# Patient Record
Sex: Female | Born: 2005
Health system: Southern US, Community
[De-identification: ages and names within clinical notes are randomized; demographics above are authoritative.]

## PROBLEM LIST (undated history)

## (undated) DIAGNOSIS — F419 Anxiety disorder, unspecified: Secondary | ICD-10-CM

## (undated) DIAGNOSIS — F32A Depression, unspecified: Secondary | ICD-10-CM

## (undated) DIAGNOSIS — F909 Attention-deficit hyperactivity disorder, unspecified type: Secondary | ICD-10-CM

## (undated) HISTORY — DX: Depression, unspecified: F32.A

---
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
## 2023-01-19 DIAGNOSIS — F411 Generalized anxiety disorder: Secondary | ICD-10-CM | POA: Diagnosis not present

## 2023-02-16 DIAGNOSIS — F411 Generalized anxiety disorder: Secondary | ICD-10-CM | POA: Diagnosis not present

## 2023-02-24 DIAGNOSIS — F341 Dysthymic disorder: Secondary | ICD-10-CM | POA: Diagnosis not present

## 2023-02-24 DIAGNOSIS — F324 Major depressive disorder, single episode, in partial remission: Secondary | ICD-10-CM | POA: Diagnosis not present

## 2023-03-16 DIAGNOSIS — F411 Generalized anxiety disorder: Secondary | ICD-10-CM | POA: Diagnosis not present

## 2023-03-17 DIAGNOSIS — F324 Major depressive disorder, single episode, in partial remission: Secondary | ICD-10-CM | POA: Diagnosis not present

## 2023-04-13 DIAGNOSIS — F411 Generalized anxiety disorder: Secondary | ICD-10-CM | POA: Diagnosis not present

## 2023-04-19 DIAGNOSIS — Z0000 Encounter for general adult medical examination without abnormal findings: Secondary | ICD-10-CM | POA: Diagnosis not present

## 2023-05-11 DIAGNOSIS — F411 Generalized anxiety disorder: Secondary | ICD-10-CM | POA: Diagnosis not present

## 2023-05-18 DIAGNOSIS — Z6826 Body mass index (BMI) 26.0-26.9, adult: Secondary | ICD-10-CM | POA: Diagnosis not present

## 2023-05-18 DIAGNOSIS — Z01419 Encounter for gynecological examination (general) (routine) without abnormal findings: Secondary | ICD-10-CM | POA: Diagnosis not present

## 2023-05-25 ENCOUNTER — Other Ambulatory Visit (HOSPITAL_BASED_OUTPATIENT_CLINIC_OR_DEPARTMENT_OTHER): Payer: Self-pay

## 2023-05-25 MED ORDER — AMPHET-DEXTROAMPHET 3-BEAD ER 50 MG PO CP24
50.0000 mg | ORAL_CAPSULE | Freq: Every morning | ORAL | 0 refills | Status: DC
  Filled 2023-05-25: qty 90, 90d supply, fill #0

## 2023-06-19 DIAGNOSIS — F411 Generalized anxiety disorder: Secondary | ICD-10-CM | POA: Diagnosis not present

## 2023-07-07 DIAGNOSIS — F411 Generalized anxiety disorder: Secondary | ICD-10-CM | POA: Diagnosis not present

## 2023-08-04 DIAGNOSIS — F411 Generalized anxiety disorder: Secondary | ICD-10-CM | POA: Diagnosis not present

## 2023-09-01 DIAGNOSIS — F411 Generalized anxiety disorder: Secondary | ICD-10-CM | POA: Diagnosis not present

## 2023-09-29 DIAGNOSIS — F411 Generalized anxiety disorder: Secondary | ICD-10-CM | POA: Diagnosis not present

## 2023-10-10 DIAGNOSIS — R5382 Chronic fatigue, unspecified: Secondary | ICD-10-CM | POA: Diagnosis not present

## 2023-10-11 DIAGNOSIS — R5382 Chronic fatigue, unspecified: Secondary | ICD-10-CM | POA: Diagnosis not present

## 2023-10-11 DIAGNOSIS — F324 Major depressive disorder, single episode, in partial remission: Secondary | ICD-10-CM | POA: Diagnosis not present

## 2023-10-11 DIAGNOSIS — R635 Abnormal weight gain: Secondary | ICD-10-CM | POA: Diagnosis not present

## 2023-10-11 DIAGNOSIS — Z5189 Encounter for other specified aftercare: Secondary | ICD-10-CM | POA: Diagnosis not present

## 2023-10-27 DIAGNOSIS — F411 Generalized anxiety disorder: Secondary | ICD-10-CM | POA: Diagnosis not present

## 2023-12-26 ENCOUNTER — Other Ambulatory Visit (HOSPITAL_BASED_OUTPATIENT_CLINIC_OR_DEPARTMENT_OTHER): Payer: Self-pay

## 2023-12-27 ENCOUNTER — Other Ambulatory Visit (HOSPITAL_BASED_OUTPATIENT_CLINIC_OR_DEPARTMENT_OTHER): Payer: Self-pay

## 2023-12-27 ENCOUNTER — Encounter: Payer: Self-pay | Admitting: Oncology

## 2023-12-27 MED ORDER — AMPHET-DEXTROAMPHET 3-BEAD ER 50 MG PO CP24
50.0000 mg | ORAL_CAPSULE | Freq: Every morning | ORAL | 0 refills | Status: AC
  Filled 2023-12-27: qty 90, 90d supply, fill #0
